# Patient Record
Sex: Male | Born: 2000 | Race: Black or African American | Hispanic: No | Marital: Single | Smoking: Never smoker
Health system: Southern US, Community
[De-identification: ages and names within clinical notes are randomized; demographics above are authoritative.]

## PROBLEM LIST (undated history)

## (undated) HISTORY — PX: TONSILLECTOMY: SUR1361

---
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
## 2023-09-06 ENCOUNTER — Ambulatory Visit: Admission: EM | Admit: 2023-09-06 | Discharge: 2023-09-06 | Disposition: A | Discharge status: Home / Self Care

## 2023-09-06 DIAGNOSIS — R2232 Localized swelling, mass and lump, left upper limb: Secondary | ICD-10-CM | POA: Diagnosis not present

## 2023-09-06 DIAGNOSIS — T63441A Toxic effect of venom of bees, accidental (unintentional), initial encounter: Secondary | ICD-10-CM | POA: Diagnosis not present

## 2023-09-06 MED ORDER — DEXAMETHASONE SODIUM PHOSPHATE 10 MG/ML IJ SOLN
10.0000 mg | Freq: Once | INTRAMUSCULAR | Status: AC
  Administered 2023-09-06: 10 mg via INTRAMUSCULAR

## 2023-09-06 NOTE — ED Provider Notes (Signed)
 [facility]    CSN: [id number] Arrival date & time: 09/06/23  1326      History   Chief Complaint Chief Complaint  Patient presents with   Insect Bite         HPI Vincent Franklin is a 23 y.o. male presenting with his mother for evaluation of redness, swelling and itching of the left hand/wrist that occurred secondary to a bee sting yesterday.  The bee stung him on his wrist.  He has had spreading swelling ever since.  Has not had any other areas of rash or swelling.  Not reporting any throat swelling/tightness, difficulty swallowing, chest tightness or shortness of breath.  No history of allergic reaction to bee stings.  Not taking any over-the-counter medications.  HPI  History reviewed. No pertinent past medical history.  There are no active problems to display for this patient.   Past Surgical History:  Procedure Laterality Date   TONSILLECTOMY         Home Medications    Prior to Admission medications   Medication Sig Start Date End Date Taking? Authorizing Provider  cephALEXin  (KEFLEX ) 750 MG capsule Take 1 capsule (750 mg total) by mouth 2 (two) times daily. 05/26/17  Yes Cuthriell, Jonathan D, PA-C  HYDROcodone -acetaminophen  (NORCO) 5-325 MG tablet Take 1 tablet by mouth every 6 (six) hours as needed for moderate pain. 02/18/18  Yes Robinette Vermell PARAS, MD  ibuprofen  (ADVIL ,MOTRIN ) 600 MG tablet Take 1 tablet (600 mg total) by mouth every 8 (eight) hours as needed. 02/18/18  Yes Sung, Jade J, MD  VYVANSE 40 MG capsule Take 1 capsule by mouth daily.   Yes [provider]    Family History History reviewed. No pertinent family history.  Social History Social History   Tobacco Use   Smoking status: Never   Smokeless tobacco: Never  Vaping Use   Vaping status: Never Used  Substance Use Topics   Alcohol use: Yes     Allergies   Patient has no known allergies.   Review of Systems Review of Systems  Constitutional:  Negative for fatigue and  fever.  Musculoskeletal:  Positive for joint swelling. Negative for arthralgias.  Skin:  Positive for color change and rash.  Neurological:  Negative for weakness and numbness.     Physical Exam Triage Vital Signs ED Triage Vitals  Encounter Vitals Group     BP 09/06/23 1358 110/69     Girls Systolic BP Percentile --      Girls Diastolic BP Percentile --      Boys Systolic BP Percentile --      Boys Diastolic BP Percentile --      Pulse Rate 09/06/23 1358 73     Resp --      Temp 09/06/23 1358 98.7 F (37.1 C)     Temp Source 09/06/23 1358 Oral     SpO2 09/06/23 1358 96 %     Weight 09/06/23 1354 180 lb (81.6 kg)     Height 09/06/23 1354 6' 5 (1.956 m)     Head Circumference --      Peak Flow --      Pain Score 09/06/23 1352 0     Pain Loc --      Pain Education --      Exclude from Growth Chart --    No data found.  Updated Vital Signs BP 110/69 (BP Location: Left Arm)   Pulse 73   Temp 98.7 F (37.1 C) (Oral)  Ht 6' 5 (1.956 m)   Wt 180 lb (81.6 kg)   SpO2 96%   BMI 21.34 kg/m      Physical Exam Vitals and nursing note reviewed.  Constitutional:      General: He is not in acute distress.    Appearance: Normal appearance. He is well-developed. He is not ill-appearing.  HENT:     Head: Normocephalic and atraumatic.  Eyes:     General: No scleral icterus.    Conjunctiva/sclera: Conjunctivae normal.  Cardiovascular:     Rate and Rhythm: Normal rate and regular rhythm.  Pulmonary:     Effort: Pulmonary effort is normal. No respiratory distress.     Breath sounds: Normal breath sounds.  Musculoskeletal:     Cervical back: Neck supple.  Skin:    General: Skin is warm and dry.     Capillary Refill: Capillary refill takes less than 2 seconds.     Findings: Rash present.  Neurological:     General: No focal deficit present.     Mental Status: He is alert. Mental status is at baseline.     Motor: No weakness.     Gait: Gait normal.  Psychiatric:         Mood and Affect: Mood normal.   LEFT HAND/WRIST: See image included in chart.  Very tiny puncture wound seen on the dorsal wrist.  Mild to moderate swelling of dorsal hand and wrist with faint erythematous rash.  Area is all nontender.  Full range of motion of wrist.   UC Treatments / Results  Labs (all labs ordered are listed, but only abnormal results are displayed) Labs Reviewed - No data to display  EKG   Radiology No results found.  Procedures Procedures (including critical care time)  Medications Ordered in UC Medications  dexamethasone  (DECADRON ) injection 10 mg (has no administration in time range)    Initial Impression / Assessment and Plan / UC Course  I have reviewed the triage vital signs and the nursing notes.  Pertinent labs & imaging results that were available during my care of the patient were reviewed by me and considered in my medical decision making (see chart for details).   23 year old male presents for swelling, rash and itching of the left hand/wrist after bee sting yesterday.  No treatment so far.  Vitals are stable and normal.  Overall well-appearing.  See image included in chart.  Has mild to moderate localized allergic reaction.  Patient agreeable to 10 mg IM dexamethasone  injection.  I had offered oral prednisone taper but he preferred to have the injection instead.  Also advised cryotherapy and at home antihistamines.  Reviewed return precautions.   Final Clinical Impressions(s) / UC Diagnoses   Final diagnoses:  Bee sting reaction, accidental or unintentional, initial encounter  Localized swelling on left hand     Discharge Instructions      -You were given a steroid injection in clinic -Take Benadryl at home and ice hand. -If increased swelling/pain or no improvement in 2 days, return for another evaluation.     ED Prescriptions   None    PDMP not reviewed this encounter.   Arvis Jolan NOVAK, PA-C 09/06/23 1456

## 2023-09-06 NOTE — ED Triage Notes (Signed)
 Pt is with his mother  Pt c/o bee sting to left hand on 09/04/23  Pt has left hand   Pt denies throat swelling, tongue swelling, or blurry vision

## 2023-09-06 NOTE — Discharge Instructions (Addendum)
-  You were given a steroid injection in clinic -Take Benadryl at home and ice hand. -If increased swelling/pain or no improvement in 2 days, return for another evaluation.
